# Patient Record
Sex: Male | Born: 1955 | Race: Black or African American | Hispanic: No | Marital: Single | Smoking: Current every day smoker
Health system: Southern US, Community
[De-identification: ages and names within clinical notes are randomized; demographics above are authoritative.]

## PROBLEM LIST (undated history)

## (undated) DIAGNOSIS — F1410 Cocaine abuse, uncomplicated: Secondary | ICD-10-CM

---

## 2010-12-08 ENCOUNTER — Emergency Department (HOSPITAL_COMMUNITY): Payer: Self-pay

## 2010-12-08 ENCOUNTER — Emergency Department (HOSPITAL_COMMUNITY)
Admission: EM | Admit: 2010-12-08 | Discharge: 2010-12-08 | Disposition: A | Discharge status: Home / Self Care | Payer: Self-pay | Attending: Emergency Medicine | Admitting: Emergency Medicine

## 2010-12-08 DIAGNOSIS — L03119 Cellulitis of unspecified part of limb: Secondary | ICD-10-CM | POA: Insufficient documentation

## 2010-12-08 DIAGNOSIS — L02619 Cutaneous abscess of unspecified foot: Secondary | ICD-10-CM | POA: Insufficient documentation

## 2010-12-16 ENCOUNTER — Emergency Department (HOSPITAL_COMMUNITY)
Admission: EM | Admit: 2010-12-16 | Discharge: 2010-12-16 | Disposition: A | Discharge status: Home / Self Care | Payer: Self-pay | Attending: Emergency Medicine | Admitting: Emergency Medicine

## 2010-12-16 ENCOUNTER — Emergency Department (HOSPITAL_COMMUNITY): Payer: Self-pay

## 2010-12-16 DIAGNOSIS — L02619 Cutaneous abscess of unspecified foot: Secondary | ICD-10-CM | POA: Insufficient documentation

## 2010-12-16 DIAGNOSIS — M79609 Pain in unspecified limb: Secondary | ICD-10-CM | POA: Insufficient documentation

## 2010-12-16 DIAGNOSIS — H409 Unspecified glaucoma: Secondary | ICD-10-CM | POA: Insufficient documentation

## 2010-12-16 DIAGNOSIS — M7989 Other specified soft tissue disorders: Secondary | ICD-10-CM | POA: Insufficient documentation

## 2012-10-28 IMAGING — CR DG FOOT COMPLETE 3+V*R*
3 series · 3 of 3 positions shown · non-contrast
Comparison: None.

CLINICAL DATA: Penetrating around the second metatarsal level.

RIGHT FOOT COMPLETE - 3+ VIEW

[x foot ap right]
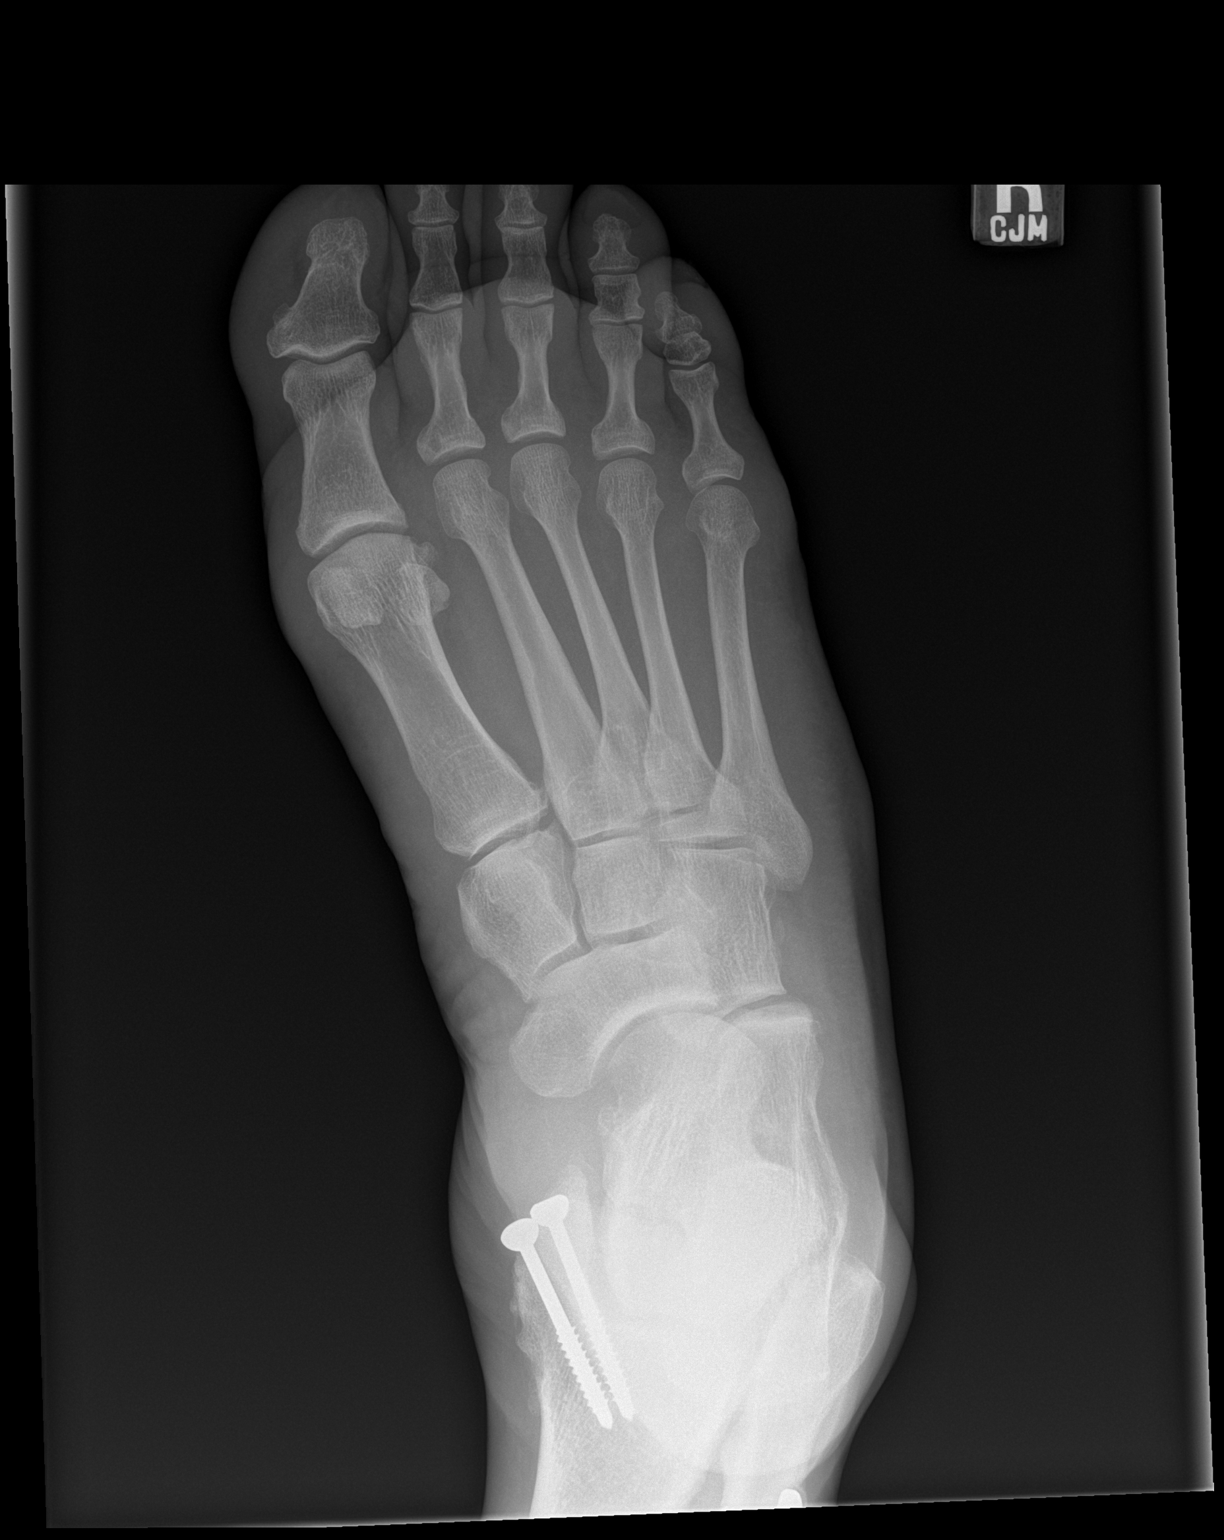

[x foot obl right]
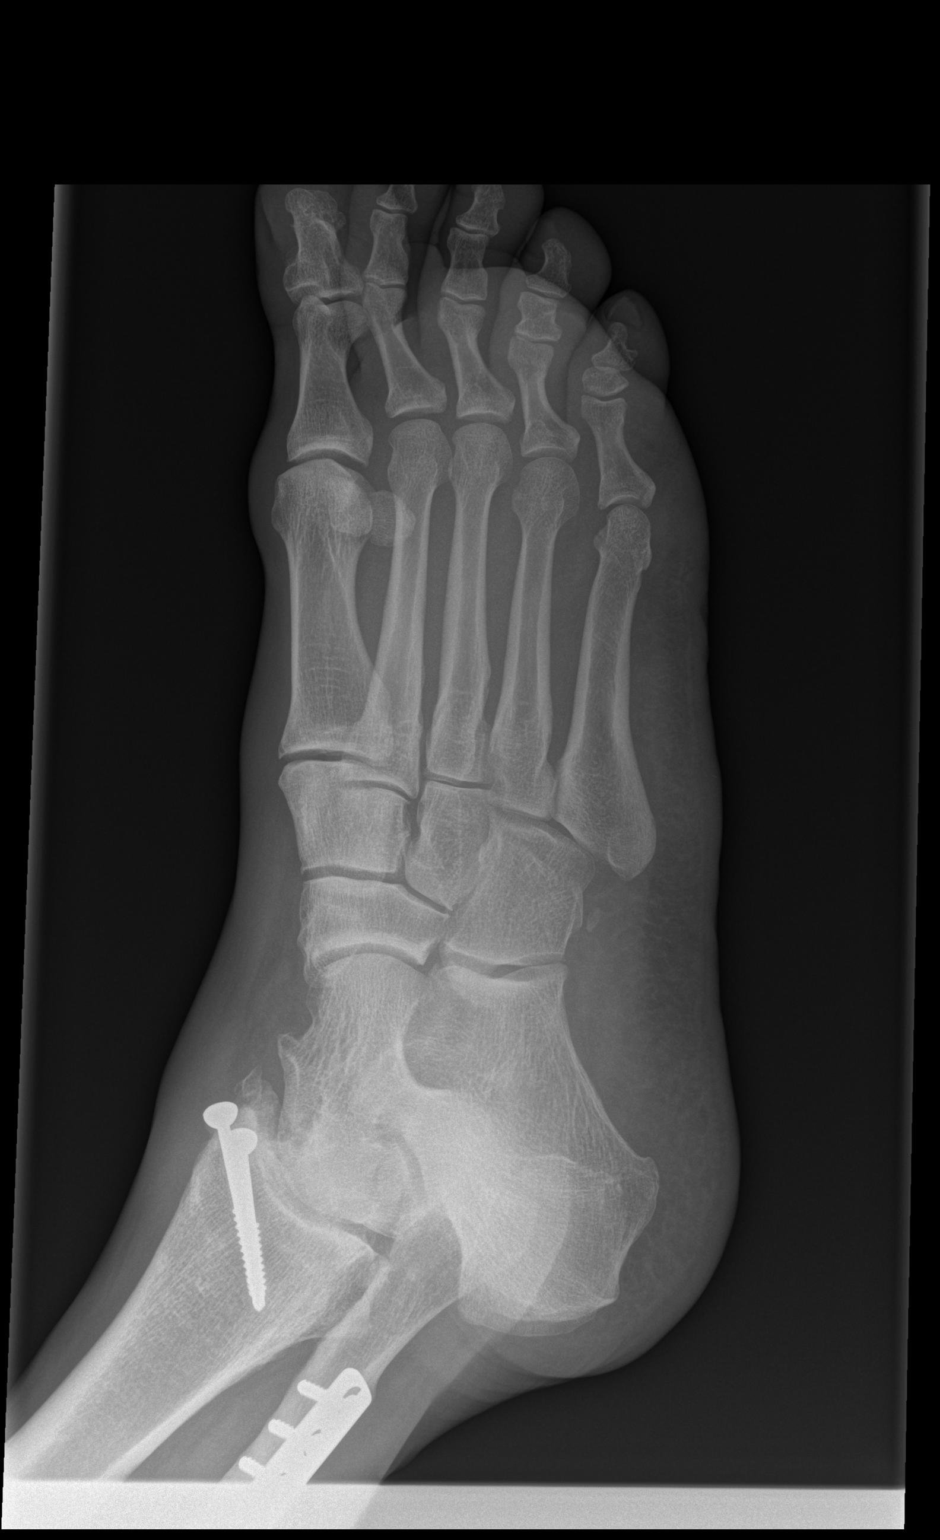

[x foot lat right]
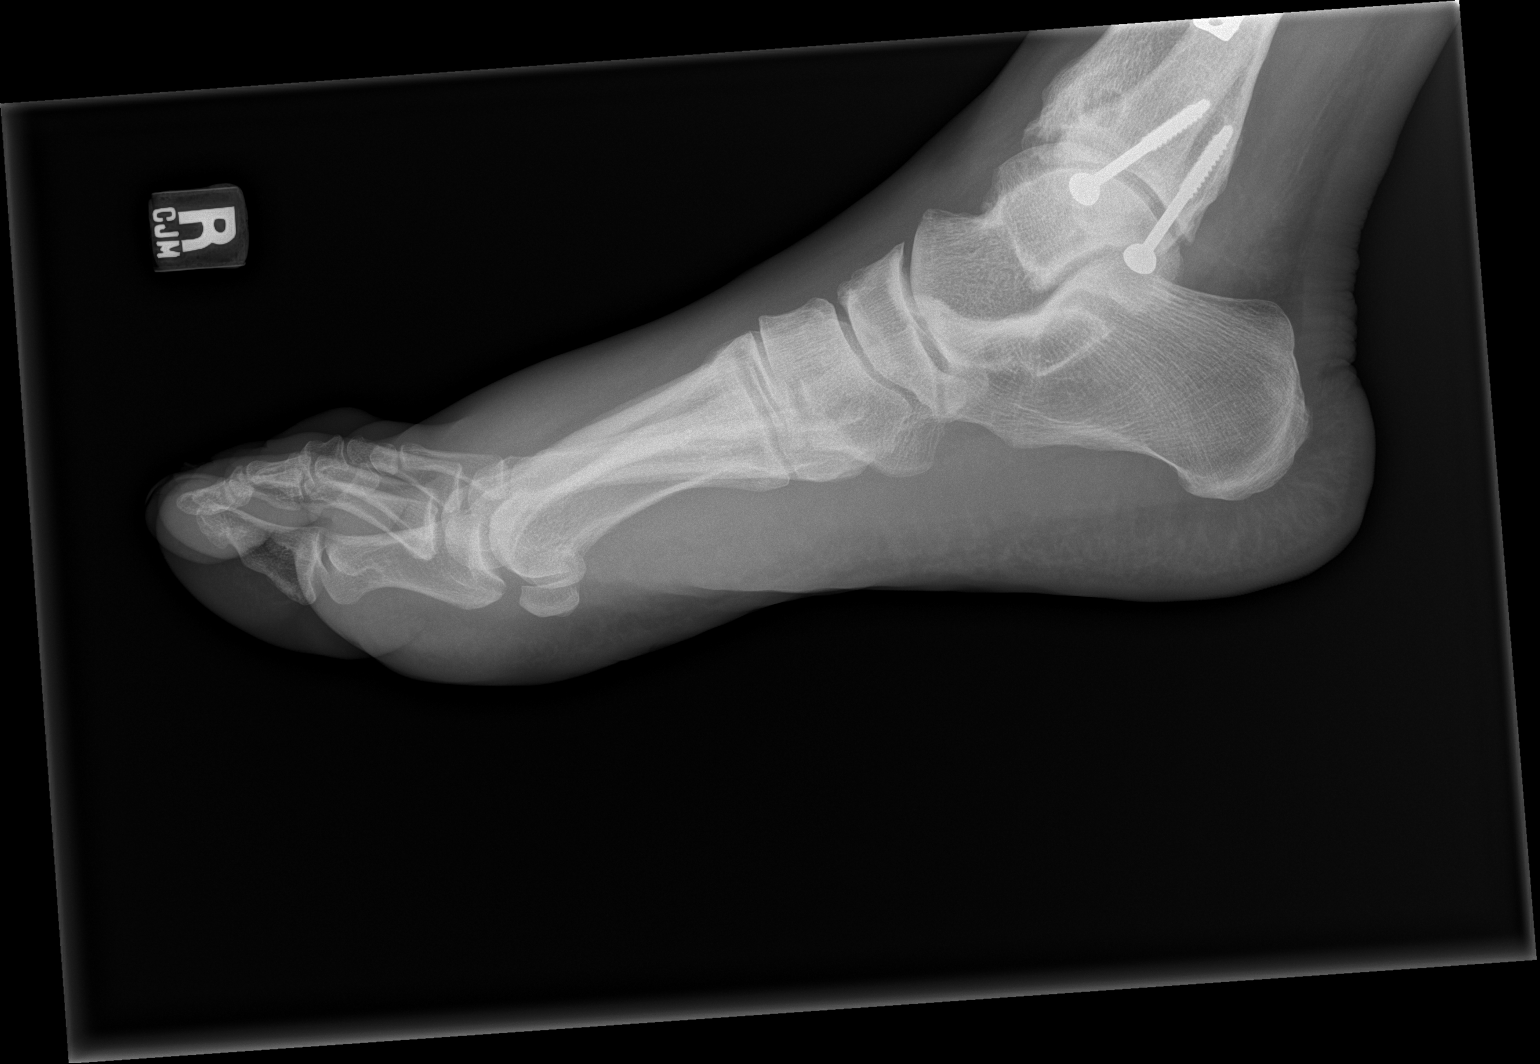

[3 of 3 positions shown; findings below may reference images not displayed]

FINDINGS: There are no abnormal radiopaque foreign bodies within
the soft tissues.  No abnormal soft tissue gas is present.  There
is no fracture or dislocation.  No cortical destruction or
periosteal reaction are present.  Incidental note is made of
orthopedic fixation of distal tibial and fibular fractures with
osteoarthritis at the ankle mortise.
IMPRESSION: No evidence of acute abnormality.

## 2012-11-05 IMAGING — CR DG FOOT COMPLETE 3+V*R*
3 series · 3 of 3 positions shown · non-contrast
Comparison: 12/08/2010

CLINICAL DATA: Stepped on nail.

RIGHT FOOT COMPLETE - 3+ VIEW

[x foot ap right]
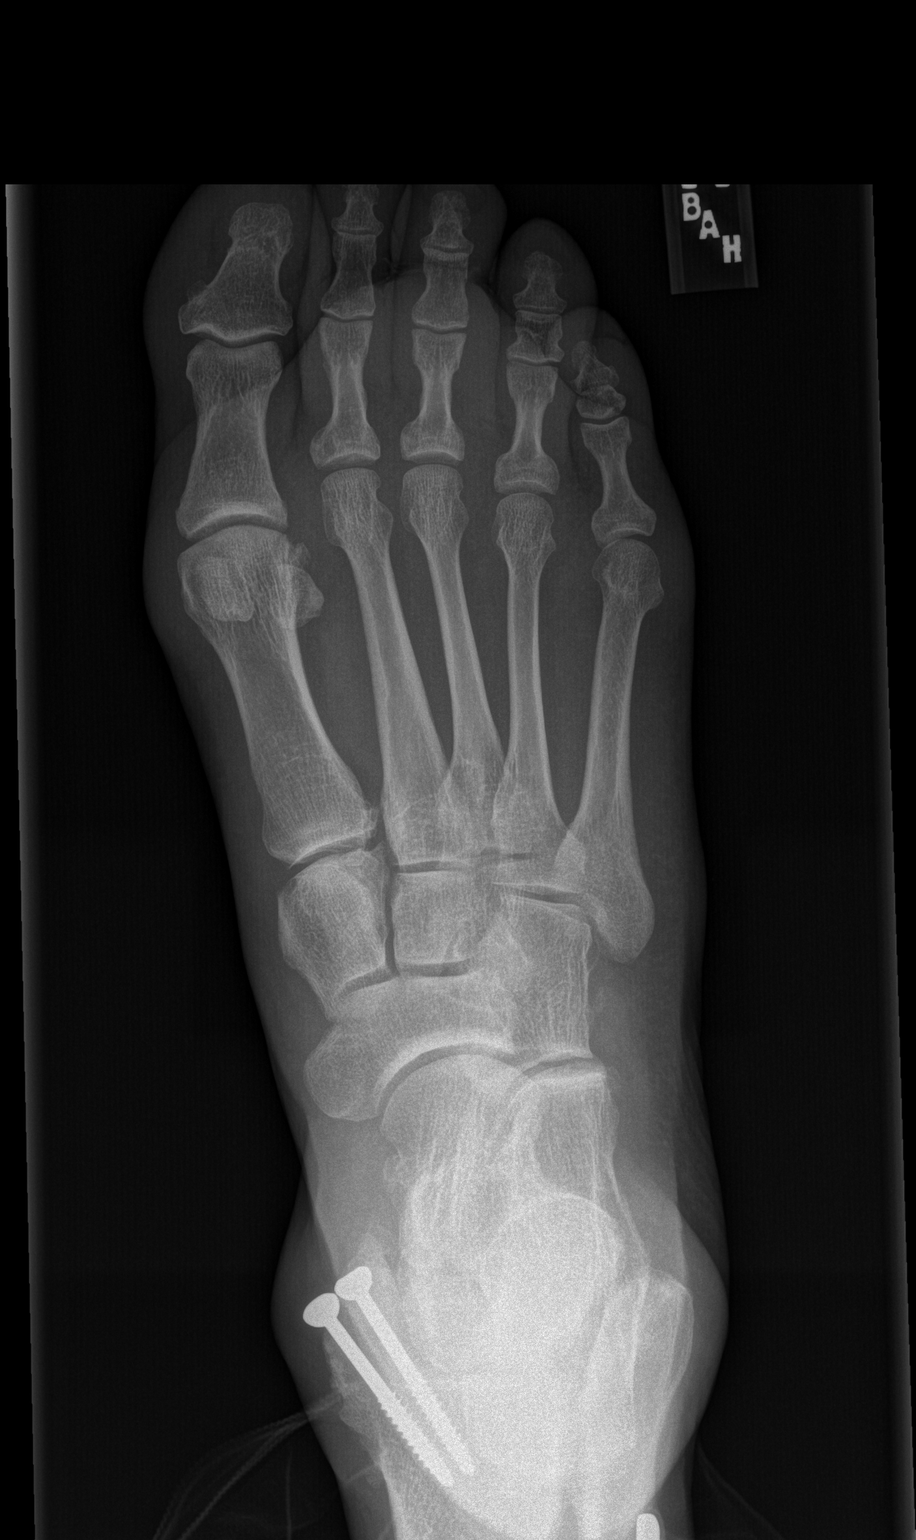

[x foot obl right]
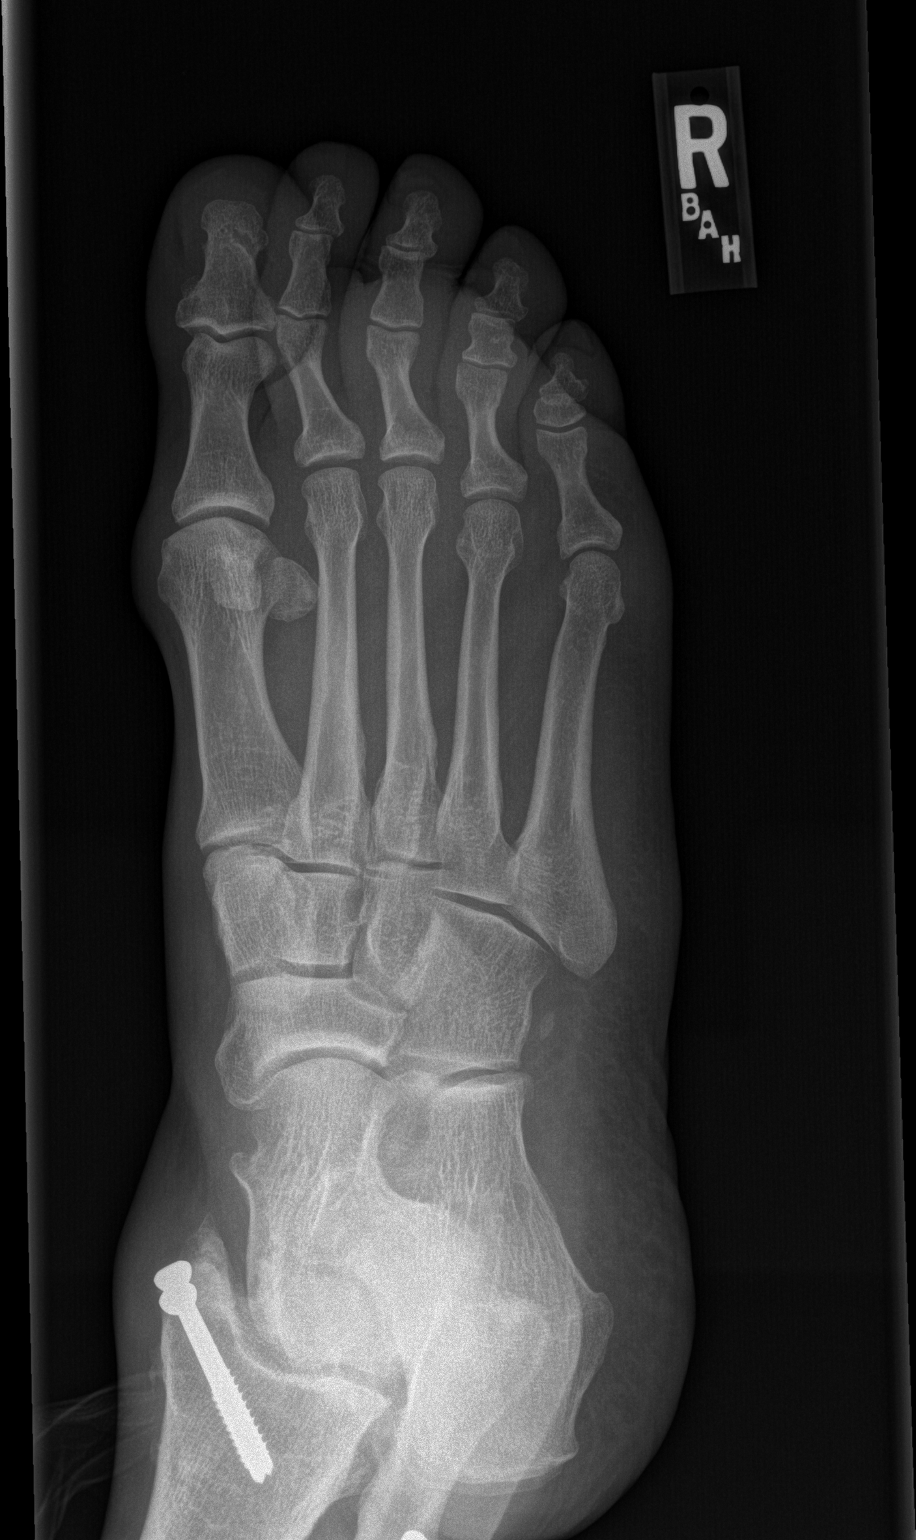

[x foot lat right]
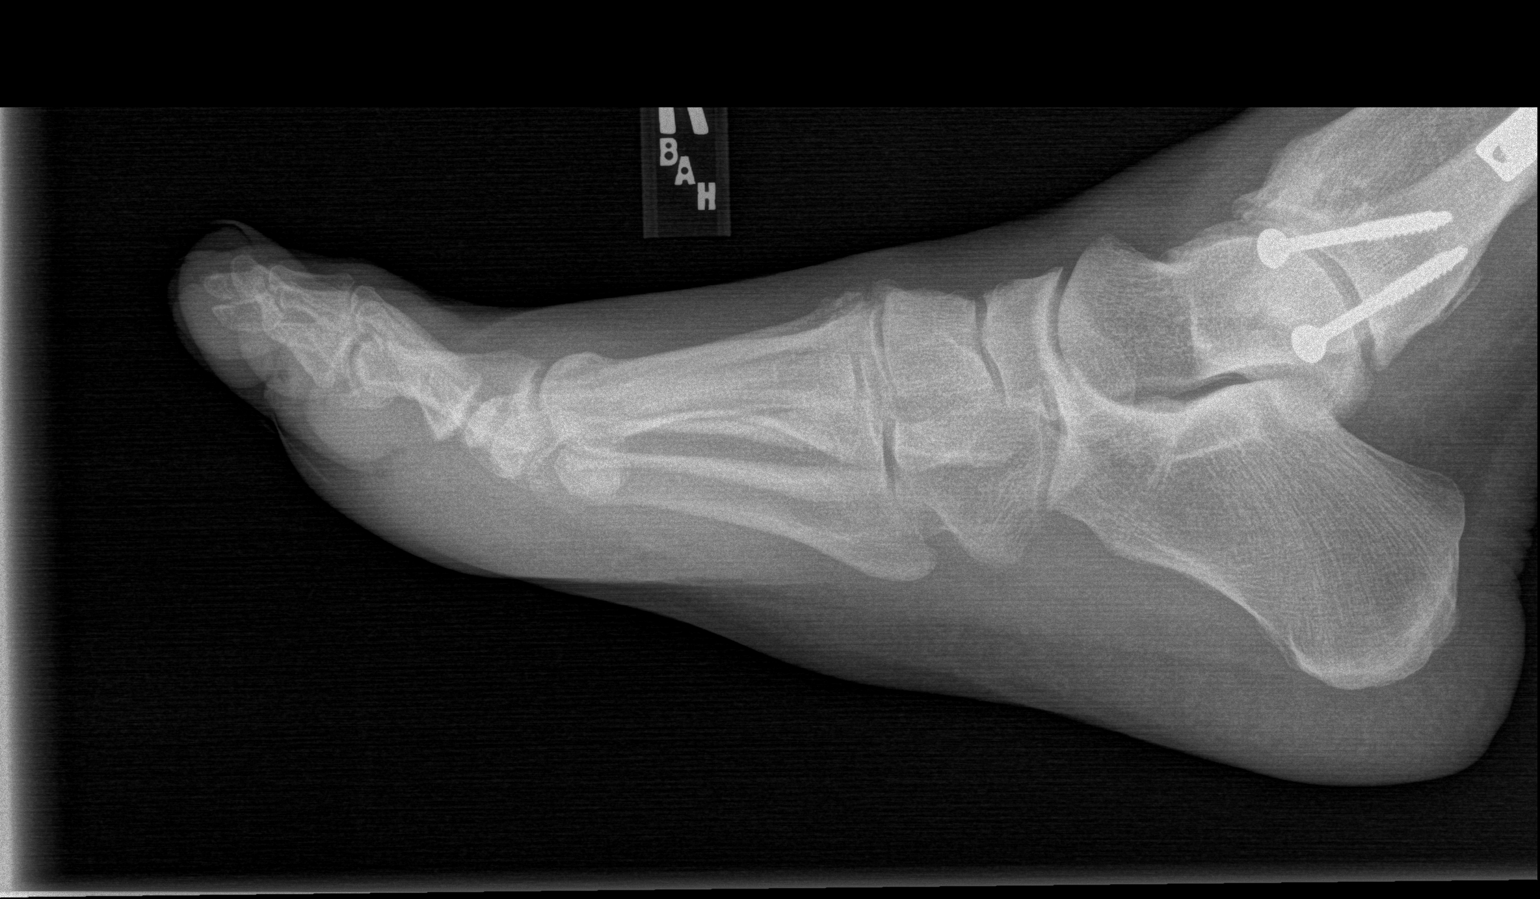

[3 of 3 positions shown; findings below may reference images not displayed]

FINDINGS: No evidence for an acute fracture.  No subluxation or
dislocation.  Two screws are seen in the medial malleolus as
before.  There is no evidence for a retained radiopaque soft tissue
foreign body.  No gas is visualized within the soft tissues.
IMPRESSION: No acute findings.

## 2016-09-05 ENCOUNTER — Encounter (HOSPITAL_COMMUNITY): Payer: Self-pay | Admitting: *Deleted

## 2016-09-05 ENCOUNTER — Emergency Department (HOSPITAL_COMMUNITY)
Admission: EM | Admit: 2016-09-05 | Discharge: 2016-09-05 | Disposition: A | Discharge status: Home / Self Care | Payer: Self-pay | Attending: Emergency Medicine | Admitting: Emergency Medicine

## 2016-09-05 DIAGNOSIS — H16001 Unspecified corneal ulcer, right eye: Secondary | ICD-10-CM | POA: Insufficient documentation

## 2016-09-05 DIAGNOSIS — H40051 Ocular hypertension, right eye: Secondary | ICD-10-CM | POA: Insufficient documentation

## 2016-09-05 DIAGNOSIS — F17200 Nicotine dependence, unspecified, uncomplicated: Secondary | ICD-10-CM | POA: Insufficient documentation

## 2016-09-05 MED ORDER — FLUORESCEIN SODIUM 0.6 MG OP STRP
1.0000 | ORAL_STRIP | Freq: Once | OPHTHALMIC | Status: AC
  Administered 2016-09-05: 1 via OPHTHALMIC
  Filled 2016-09-05: qty 1

## 2016-09-05 MED ORDER — TETRACAINE HCL 0.5 % OP SOLN
2.0000 [drp] | Freq: Once | OPHTHALMIC | Status: AC
  Administered 2016-09-05: 2 [drp] via OPHTHALMIC
  Filled 2016-09-05: qty 2

## 2016-09-05 MED ORDER — OFLOXACIN 0.3 % OP SOLN
1.0000 [drp] | OPHTHALMIC | Status: DC
  Administered 2016-09-05: 1 [drp] via OPHTHALMIC
  Filled 2016-09-05: qty 5

## 2016-09-05 NOTE — ED Triage Notes (Signed)
Pt states that he was wearing his contacts longer than he should. Pt states that Saturday he began having rt eye pain and blurred vision.

## 2016-09-05 NOTE — ED Provider Notes (Signed)
[facility]-EMERGENCY DEPT Provider Note   CSN: [id number] Arrival date & time: 09/05/16  1341  By signing my name below, I, Bruce Sanders, attest that this documentation has been prepared under the direction and in the presence of Bruce Bucco, MD. Electronically Signed: Christian Mate, Scribe. 09/05/2016. 3:14 PM.  History   Chief Complaint Chief Complaint  Patient presents with  . Eye Pain   The history is provided by the patient and medical records. No language interpreter was used.    HPI Comments:  Bruce Sanders is a 61 y.o. male with no pertinent PMHx, who presents to the Emergency Department complaining of constant right eye pain onset three days ago. Pt reports wearing some older contact lenses for 12 hours four days ago when the pain arose. The following morning, he awoke with blurry vision as the pain increased.He has been wearing the same pair of contacts for the last 1 year. He has associated symptoms of right eye watery drainage and visual disturbance. No alleviating factors reported. Pt denies fever and any other complaints at this time. Pt does not currently have a PCP or ophthalmologist.    History reviewed. No pertinent past medical history.  There are no active problems to display for this patient.  History reviewed. No pertinent surgical history.  Home Medications    Prior to Admission medications   Not on File    Family History No family history on file.  Social History Social History  Substance Use Topics  . Smoking status: Current Every Day Smoker  . Smokeless tobacco: Never Used  . Alcohol use Not on file     Allergies   Patient has no known allergies.   Review of Systems Review of Systems  Constitutional: Negative for fever.  Eyes: Positive for pain, discharge, redness and visual disturbance.  Gastrointestinal: Negative for nausea and vomiting.  Neurological: Negative for headaches.     Physical Exam Updated Vital Signs BP (!)  140/98 (BP Location: Right Arm)   Pulse 78   Temp 97.8 F (36.6 C) (Oral)   Resp 16   SpO2 100%   Physical Exam  Constitutional: He is oriented to person, place, and time. He appears well-developed and well-nourished.  HENT:  Head: Normocephalic and atraumatic.  Eyes: EOM are normal.  Diffuse erythema to right cornea with watery discharge. No crusting. EOM intact. No skin rashes noted.  There appears to be a corneal abrasion to the right eye at the 4:00 position. Slit-lamp exam has no fluoroscein uptake. No dendrites are seen.  Tono-Pen reveals an increased pressure in the right eye in the upper 40s to low 50s. The most consistent pressure was 49. Pressures in the left eye were in the low 20s. Patient's able to see my fingers but cannot tell me how many fingers I'm holding up with the right eye.    Neck: Normal range of motion. Neck supple.  Cardiovascular: Normal rate.   Pulmonary/Chest: Effort normal.  Neurological: He is alert and oriented to person, place, and time.  Skin: Skin is warm and dry.  Psychiatric: He has a normal mood and affect.     ED Treatments / Results  DIAGNOSTIC STUDIES:  Oxygen Saturation is 100% on RA, normal by my interpretation.    COORDINATION OF CARE:  3:09 PM Discussed treatment plan with pt at bedside including Abx and a slit lamp examination and pt agreed to plan.  4:31 PM Consult to Dr. Charlotte Sanes, Ophthalmologist, who recommends starting on Ofloxacin eye  drops. She will see him in the morning at 8:30AM to recheck eye pressures.  Labs (all labs ordered are listed, but only abnormal results are displayed) Labs Reviewed - No data to display  EKG  EKG Interpretation None       Radiology No results found.  Procedures Procedures (including critical care time)  Medications Ordered in ED Medications  ofloxacin (OCUFLOX) 0.3 % ophthalmic solution 1 drop (not administered)  fluorescein ophthalmic strip 1 strip (1 strip Right Eye Given 09/05/16  1523)  tetracaine (PONTOCAINE) 0.5 % ophthalmic solution 2 drop (2 drops Right Eye Given 09/05/16 1523)     Initial Impression / Assessment and Plan / ED Course  I have reviewed the triage vital signs and the nursing notes.  Pertinent labs & imaging results that were available during my care of the patient were reviewed by me and considered in my medical decision making (see chart for details).     Patient presents with right eye pain and redness and watery discharge. He has diminished vision in the right eye and can only see shapes. He has an increased pressure and evidence of a corneal abrasion. I spoke with Dr. Charlotte Sanes with ophthalmology who feels that it would be most appropriate to start the patient on ofloxacin eyedrops and she will see the patient in the morning to recheck the pressures and do a complete eye exam. She did not want to start the patient on glaucoma type medications at this point given his lack of headache, nausea vomiting or other symptoms that we more consistent with acute glaucoma.  I explained this to the patient. He was dispensed ofloxacin eyedrops from the ED. He states that he will be able to follow-up at 8:30 in the morning and Dr. Marnee Spring office.  Final Clinical Impressions(s) / ED Diagnoses   Final diagnoses:  Ulcer of right cornea  Increased pressure in the eye, right    New Prescriptions New Prescriptions   No medications on file    I personally performed the services described in this documentation, which was scribed in my presence.  The recorded information has been reviewed and considered.     Bruce Bucco, MD 09/05/16 [phone]

## 2016-12-05 ENCOUNTER — Emergency Department (HOSPITAL_BASED_OUTPATIENT_CLINIC_OR_DEPARTMENT_OTHER)
Admission: EM | Admit: 2016-12-05 | Discharge: 2016-12-05 | Disposition: A | Discharge status: Home / Self Care | Payer: Self-pay | Attending: Emergency Medicine | Admitting: Emergency Medicine

## 2016-12-05 ENCOUNTER — Encounter (HOSPITAL_BASED_OUTPATIENT_CLINIC_OR_DEPARTMENT_OTHER): Payer: Self-pay

## 2016-12-05 DIAGNOSIS — F1410 Cocaine abuse, uncomplicated: Secondary | ICD-10-CM | POA: Insufficient documentation

## 2016-12-05 DIAGNOSIS — F17210 Nicotine dependence, cigarettes, uncomplicated: Secondary | ICD-10-CM | POA: Insufficient documentation

## 2016-12-05 DIAGNOSIS — I10 Essential (primary) hypertension: Secondary | ICD-10-CM | POA: Insufficient documentation

## 2016-12-05 HISTORY — DX: Cocaine abuse, uncomplicated: F14.10

## 2016-12-05 LAB — BASIC METABOLIC PANEL
Anion gap: 5 (ref 5–15)
BUN: 16 mg/dL (ref 6–20)
CO2: 28 mmol/L (ref 22–32)
Calcium: 8.3 mg/dL — ABNORMAL LOW (ref 8.9–10.3)
Chloride: 107 mmol/L (ref 101–111)
Creatinine, Ser: 1.22 mg/dL (ref 0.61–1.24)
GFR calc non Af Amer: >60 mL/min (ref >60)
Glucose, Bld: 83 mg/dL (ref 65–99)
Potassium: 3.7 mmol/L (ref 3.5–5.1)
SODIUM: 140 mmol/L (ref 135–145)

## 2016-12-05 LAB — CBC WITH DIFFERENTIAL/PLATELET
BASOS ABS: 0 10*3/uL (ref 0.0–0.1)
Basophils Relative: 1 %
Eosinophils Absolute: 0.2 10*3/uL (ref 0.0–0.7)
Eosinophils Relative: 4 %
HEMATOCRIT: 37 % — AB (ref 39.0–52.0)
Hemoglobin: 13 g/dL (ref 13.0–17.0)
LYMPHS ABS: 1.6 10*3/uL (ref 0.7–4.0)
LYMPHS PCT: 39 %
MCH: 27.8 pg (ref 26.0–34.0)
MCHC: 35.1 g/dL (ref 30.0–36.0)
MCV: 79.1 fL (ref 78.0–100.0)
MONO ABS: 0.4 10*3/uL (ref 0.1–1.0)
Monocytes Relative: 11 %
NEUTROS ABS: 1.9 10*3/uL (ref 1.7–7.7)
Neutrophils Relative %: 45 %
Platelets: 205 10*3/uL (ref 150–400)
RBC: 4.68 MIL/uL (ref 4.22–5.81)
RDW: 13.8 % (ref 11.5–15.5)
WBC: 4.1 10*3/uL (ref 4.0–10.5)

## 2016-12-05 LAB — URINALYSIS, ROUTINE W REFLEX MICROSCOPIC
Bilirubin Urine: NEGATIVE
GLUCOSE, UA: NEGATIVE mg/dL
Hgb urine dipstick: NEGATIVE
Ketones, ur: NEGATIVE mg/dL
LEUKOCYTES UA: NEGATIVE
Nitrite: NEGATIVE
PH: 5.5 (ref 5.0–8.0)
Protein, ur: NEGATIVE mg/dL
Specific Gravity, Urine: 1.013 (ref 1.005–1.030)

## 2016-12-05 LAB — RAPID URINE DRUG SCREEN, HOSP PERFORMED
AMPHETAMINES: NOT DETECTED
BARBITURATES: NOT DETECTED
BENZODIAZEPINES: NOT DETECTED
Cocaine: POSITIVE — AB
Opiates: NOT DETECTED
TETRAHYDROCANNABINOL: NOT DETECTED

## 2016-12-05 LAB — TSH: TSH: 2.119 u[IU]/mL (ref 0.350–4.500)

## 2016-12-05 MED ORDER — LISINOPRIL 10 MG PO TABS
5.0000 mg | ORAL_TABLET | Freq: Once | ORAL | Status: AC
  Administered 2016-12-05: 5 mg via ORAL
  Filled 2016-12-05: qty 1

## 2016-12-05 MED ORDER — LISINOPRIL 5 MG PO TABS: 5.0000 mg | ORAL_TABLET | Freq: Every day | ORAL | 0 refills | Status: AC

## 2016-12-05 MED FILL — LISINOPRIL 5 MG TAB: 5 | 30 days supply | Qty: 30 | Fill #0

## 2016-12-05 NOTE — Discharge Instructions (Signed)
It is very important that you schedule follow-up with family doctor for ongoing monitoring of your blood pressure. You have been prescribed lisinopril to take once daily. Your blood pressure must see monitored at least several times a week for the next few weeks as well as repeat labs at the end of the month. You also need a family doctor to continue routine health care and initiate preventative care.

## 2016-12-05 NOTE — ED Triage Notes (Addendum)
Pt states he attempted to get [facility] but was denied and sent here for elevated BP-pt reports no hx HTN-NAD-steady gait

## 2016-12-05 NOTE — ED Notes (Signed)
Went to see patient and when I got to his room, he alreaday asked if he can leave.  I informed him that the doctor has to see him first.  I asked him as to what brought him here.  [facility]  He stated that his BP was high and [facility] recommend him to come here.

## 2016-12-05 NOTE — ED Notes (Signed)
ED Provider at bedside. 

## 2016-12-05 NOTE — ED Notes (Signed)
Pt denies any pain.  [facility]

## 2016-12-05 NOTE — ED Provider Notes (Signed)
[facility]   CSN: [id number] Arrival date & time: 12/05/16  1451     History   Chief Complaint Chief Complaint  Patient presents with  . Hypertension    HPI [name] is a 61 y.o. male.  HPI Patient [facility]. He reports he was told he could not be seen or treated because he had elevated blood pressure. Patient reports he has no symptoms. He denies headache, visual changes, chest pain. Review systems is negative. Patient reports he smokes cigarettes and does use cocaine for which he was seeking treatment. He denies other drugs of abuse. Patient denies any history of regular medical care. He has no medical problems that he is aware of. Past Medical History:  Diagnosis Date  . Cocaine abuse     There are no active problems to display for this patient.   History reviewed. No pertinent surgical history.     Home Medications    Prior to Admission medications   Medication Sig Start Date End Date Taking? Authorizing Provider  lisinopril (PRINIVIL,ZESTRIL) 5 MG tablet Take 1 tablet (5 mg total) by mouth daily. 12/05/16   Arby BarrettePfeiffer, Cree Kunert, MD    Family History No family history on file.  Social History Social History  Substance Use Topics  . Smoking status: Current Every Day Smoker    Types: Cigarettes  . Smokeless tobacco: Never Used  . Alcohol use No     Allergies   Patient has no known allergies.   Review of Systems Review of Systems 10 Systems reviewed and are negative for acute change except as noted in the HPI.   Physical Exam Updated Vital Signs BP (!) 169/99 (BP Location: Left Arm)   Pulse 69   Temp 97.7 F (36.5 C) (Oral)   Resp 18   Ht 6\' 2"  (1.88 m)   Wt 81.6 kg (180 lb)   SpO2 100%   BMI 23.11 kg/m   Physical Exam  Constitutional: He is oriented to person, place, and time. He appears well-developed and well-nourished.  HENT:  Head: Normocephalic and  atraumatic.  Mouth/Throat: Oropharynx is clear and moist.  Eyes: Pupils are equal, round, and reactive to light. Conjunctivae and EOM are normal.  Neck: Neck supple. No thyromegaly present.  Cardiovascular: Normal rate, regular rhythm, normal heart sounds and intact distal pulses.   No murmur heard. Pulmonary/Chest: Effort normal and breath sounds normal. No respiratory distress.  Abdominal: Soft. There is no tenderness.  Musculoskeletal: Normal range of motion. He exhibits no edema or tenderness.  Neurological: He is alert and oriented to person, place, and time. No cranial nerve deficit. He exhibits normal muscle tone. Coordination normal.  Skin: Skin is warm and dry.  Psychiatric: He has a normal mood and affect.  Nursing note and vitals reviewed.    ED Treatments / Results  Labs (all labs ordered are listed, but only abnormal results are displayed) Labs Reviewed  BASIC METABOLIC PANEL - Abnormal; Notable for the following:       Result Value   Calcium 8.3 (*)    All other components within normal limits  CBC WITH DIFFERENTIAL/PLATELET - Abnormal; Notable for the following:    HCT 37.0 (*)    All other components within normal limits  RAPID URINE DRUG SCREEN, HOSP PERFORMED - Abnormal; Notable for the following:    Cocaine POSITIVE (*)    All other components within normal limits  URINALYSIS, ROUTINE  W REFLEX MICROSCOPIC  TSH    EKG  EKG Interpretation None       Radiology No results found.  Procedures Procedures (including critical care time)  Medications Ordered in ED Medications - No data to display   Initial Impression / Assessment and Plan / ED Course  I have reviewed the triage vital signs and the nursing notes.  Pertinent labs & imaging results that were available during my care of the patient were reviewed by me and considered in my medical decision making (see chart for details).      Final Clinical Impressions(s) / ED Diagnoses   Final  diagnoses:  Essential hypertension  Cocaine abuse   Patient has had elevated diastolic blood pressure documented on visit in April for ocular complaint and today. Although patient has not been seeking routine care, it is likely he has persistent diastolic hypertension. Patient does not have any positives on review systems. He sought care because he was refused at [facility] due to hypertension. Patient is counseled on cocaine cessation and continued to pursue treatment. Patient is counseled on necessity of PCP follow-up and routine health maintenance and care with monitoring of hypertension response to lisinopril. New Prescriptions New Prescriptions   LISINOPRIL (PRINIVIL,ZESTRIL) 5 MG TABLET    Take 1 tablet (5 mg total) by mouth daily.     Arby Barrette, MD 12/05/16 [phone]
